# Patient Record
Sex: Male | Born: 1975 | Race: Black or African American | Hispanic: No | Marital: Married | Smoking: Never smoker
Health system: Southern US, Community
[De-identification: ages and names within clinical notes are randomized; demographics above are authoritative.]

## PROBLEM LIST (undated history)

## (undated) HISTORY — PX: LEG SURGERY: SHX1003

---

## 2011-11-20 ENCOUNTER — Encounter: Payer: Self-pay | Admitting: Emergency Medicine

## 2011-11-20 ENCOUNTER — Emergency Department (HOSPITAL_COMMUNITY)
Admission: EM | Admit: 2011-11-20 | Discharge: 2011-11-21 | Disposition: A | Discharge status: Home / Self Care | Payer: BC Managed Care – PPO | Attending: Emergency Medicine | Admitting: Emergency Medicine

## 2011-11-20 ENCOUNTER — Emergency Department (HOSPITAL_COMMUNITY): Payer: BC Managed Care – PPO

## 2011-11-20 DIAGNOSIS — R209 Unspecified disturbances of skin sensation: Secondary | ICD-10-CM | POA: Insufficient documentation

## 2011-11-20 DIAGNOSIS — S8990XA Unspecified injury of unspecified lower leg, initial encounter: Secondary | ICD-10-CM

## 2011-11-20 DIAGNOSIS — W19XXXA Unspecified fall, initial encounter: Secondary | ICD-10-CM

## 2011-11-20 DIAGNOSIS — IMO0002 Reserved for concepts with insufficient information to code with codable children: Secondary | ICD-10-CM | POA: Insufficient documentation

## 2011-11-20 DIAGNOSIS — M7989 Other specified soft tissue disorders: Secondary | ICD-10-CM | POA: Insufficient documentation

## 2011-11-20 DIAGNOSIS — W108XXA Fall (on) (from) other stairs and steps, initial encounter: Secondary | ICD-10-CM | POA: Insufficient documentation

## 2011-11-20 DIAGNOSIS — S8390XA Sprain of unspecified site of unspecified knee, initial encounter: Secondary | ICD-10-CM

## 2011-11-20 DIAGNOSIS — M25469 Effusion, unspecified knee: Secondary | ICD-10-CM | POA: Insufficient documentation

## 2011-11-20 DIAGNOSIS — M25569 Pain in unspecified knee: Secondary | ICD-10-CM | POA: Insufficient documentation

## 2011-11-20 DIAGNOSIS — M25461 Effusion, right knee: Secondary | ICD-10-CM

## 2011-11-20 MED ORDER — IBUPROFEN 800 MG PO TABS
800.0000 mg | ORAL_TABLET | Freq: Once | ORAL | Status: AC
  Administered 2011-11-21: 800 mg via ORAL
  Filled 2011-11-20: qty 1

## 2011-11-20 MED ORDER — HYDROCODONE-ACETAMINOPHEN 5-325 MG PO TABS
1.0000 | ORAL_TABLET | Freq: Once | ORAL | Status: AC
  Administered 2011-11-21: 1 via ORAL
  Filled 2011-11-20: qty 1

## 2011-11-20 NOTE — ED Notes (Signed)
Pt st's he was going down steps and missed a step st's right knee twisted and buckled under him.

## 2011-11-21 MED ORDER — IBUPROFEN 400 MG PO TABS: 400.0000 mg | ORAL_TABLET | Freq: Three times a day (TID) | ORAL | Status: AC | PRN

## 2011-11-21 MED ORDER — HYDROCODONE-ACETAMINOPHEN 5-500 MG PO TABS: 1.0000 | ORAL_TABLET | ORAL | Status: AC | PRN

## 2011-11-21 NOTE — ED Provider Notes (Signed)
History     CSN: [id number] Arrival date & time: 11/20/2011 10:05 PM   First MD Initiated Contact with Patient 11/20/11 2321      Chief Complaint  Patient presents with  . Knee Injury   HPI: Patient is a 35 y.o. male presenting with knee pain.  Knee Pain This is a new problem. The current episode started today. The problem occurs constantly. Associated symptoms include joint swelling and numbness. Pertinent negatives include no weakness. The symptoms are aggravated by walking. He has tried ice for the symptoms. The treatment provided mild relief.   reports he fell tonight going down stairs approximately 8:30 PM. The mechanism of injury is described as a twisting type injury during the fall and blunt trauma. States was unable to bear weight due to pain after the fall. Since all has had increasing swelling and pain to the right knee.  No past medical history on file.  No past surgical history on file.  No family history on file.  History  Substance Use Topics  . Smoking status: Not on file  . Smokeless tobacco: Not on file  . Alcohol Use: Not on file      Review of Systems  Constitutional: Negative.   HENT: Negative.   Eyes: Negative.   Respiratory: Negative.   Cardiovascular: Negative.   Gastrointestinal: Negative.   Genitourinary: Negative.   Musculoskeletal: Positive for joint swelling.  Skin: Negative.   Neurological: Positive for numbness. Negative for weakness.  Hematological: Negative.   Psychiatric/Behavioral: Negative.     Allergies  Review of patient's allergies indicates no known allergies.  Home Medications  No current outpatient prescriptions on file.  BP 128/87  Pulse 77  Temp(Src) 97.6 F (36.4 C) (Oral)  Resp 18  SpO2 98%  Physical Exam  Constitutional: He appears well-developed and well-nourished.  HENT:  Head: Normocephalic and atraumatic.  Eyes: Conjunctivae are normal.  Cardiovascular: Normal rate.   Pulmonary/Chest: Effort normal.    Musculoskeletal: Normal range of motion.       Legs:      Mild swelling and TTP to (R) knee. No obvious deformity or open wound.  Neurological: He is alert.  Skin: Skin is warm and dry.  Psychiatric: He has a normal mood and affect.    ED Course  Procedures findings and impression discussed with patient. Will place(R)  knee immobilizer, fit patient for crutches and provide short course of medication for pain. Patient is to arrange followup with his orthopedist. Patient is agreeable with pain  Labs Reviewed - No data to display Dg Knee Complete 4 Views Right  11/20/2011  *RADIOLOGY REPORT*  Clinical Data: Right knee pain status post fall.  RIGHT KNEE - COMPLETE 4+ VIEW  Comparison: None.  Findings: No displaced acute fracture or dislocation identified. No aggressive appearing osseous lesion.  Tiny joint effusion.  IMPRESSION: No acute osseous abnormality.  Tiny joint effusion.If clinical concern for a fracture persists, recommend a repeat radiograph in 5- 10 days to evaluate for interval change or callus formation.  Original Report Authenticated By: Waneta Martins, M.D.     No diagnosis found.    MDM  (R) knee effusion s/p fall. Small fx can not be excluded. Ortho f/u recommended.        Leanne Chang, NP 11/21/11 [phone]

## 2011-11-21 NOTE — ED Notes (Signed)
Pt reports knee feels better after immobilization.

## 2011-11-21 NOTE — ED Notes (Signed)
Pt ambulated with a steady gait; VSS; A&Ox3; no signs of distress; no questions at this time; respirations even and unlabored; skin warm and dry.  

## 2011-11-21 NOTE — ED Provider Notes (Signed)
Medical screening examination/treatment/procedure(s) were performed by non-physician practitioner and as supervising physician I was immediately available for consultation/collaboration.    Zakariya Knickerbocker R Jinna Weinman, MD 11/21/11 0728 

## 2011-11-21 NOTE — ED Notes (Signed)
Ortho tech paged  

## 2011-11-25 ENCOUNTER — Other Ambulatory Visit: Payer: Self-pay | Admitting: Orthopedic Surgery

## 2011-11-28 ENCOUNTER — Other Ambulatory Visit: Payer: Self-pay | Admitting: Family Medicine

## 2011-11-28 DIAGNOSIS — M25561 Pain in right knee: Secondary | ICD-10-CM

## 2011-11-28 MED ORDER — CEFAZOLIN SODIUM-DEXTROSE 2-3 GM-% IV SOLR: 2.0000 g | INTRAVENOUS | Status: DC

## 2011-11-29 ENCOUNTER — Encounter (HOSPITAL_COMMUNITY)
Admission: RE | Disposition: A | Discharge status: Home / Self Care | Payer: Self-pay | Source: Ambulatory Visit | Attending: Orthopedic Surgery

## 2011-11-29 ENCOUNTER — Ambulatory Visit (HOSPITAL_COMMUNITY)
Admission: RE | Admit: 2011-11-29 | Discharge: 2011-11-29 | Disposition: A | Discharge status: Home / Self Care | Payer: BC Managed Care – PPO | Source: Ambulatory Visit | Attending: Orthopedic Surgery | Admitting: Orthopedic Surgery

## 2011-11-29 SURGERY — REPAIR, TENDON, PATELLAR
Anesthesia: Choice | Laterality: Right

## 2011-12-04 ENCOUNTER — Ambulatory Visit
Admission: RE | Admit: 2011-12-04 | Discharge: 2011-12-04 | Disposition: A | Discharge status: Home / Self Care | Payer: BC Managed Care – PPO | Source: Ambulatory Visit | Attending: Family Medicine | Admitting: Family Medicine

## 2011-12-04 DIAGNOSIS — M25561 Pain in right knee: Secondary | ICD-10-CM

## 2011-12-05 ENCOUNTER — Other Ambulatory Visit: Payer: BC Managed Care – PPO

## 2012-01-15 ENCOUNTER — Emergency Department (HOSPITAL_BASED_OUTPATIENT_CLINIC_OR_DEPARTMENT_OTHER)
Admission: EM | Admit: 2012-01-15 | Discharge: 2012-01-15 | Disposition: A | Discharge status: Home / Self Care | Payer: No Typology Code available for payment source | Attending: Emergency Medicine | Admitting: Emergency Medicine

## 2012-01-15 ENCOUNTER — Emergency Department (INDEPENDENT_AMBULATORY_CARE_PROVIDER_SITE_OTHER): Payer: No Typology Code available for payment source

## 2012-01-15 ENCOUNTER — Encounter (HOSPITAL_BASED_OUTPATIENT_CLINIC_OR_DEPARTMENT_OTHER): Payer: Self-pay | Admitting: Family Medicine

## 2012-01-15 DIAGNOSIS — M25569 Pain in unspecified knee: Secondary | ICD-10-CM

## 2012-01-15 DIAGNOSIS — IMO0002 Reserved for concepts with insufficient information to code with codable children: Secondary | ICD-10-CM | POA: Insufficient documentation

## 2012-01-15 DIAGNOSIS — Y92410 Unspecified street and highway as the place of occurrence of the external cause: Secondary | ICD-10-CM | POA: Insufficient documentation

## 2012-01-15 DIAGNOSIS — S8390XA Sprain of unspecified site of unspecified knee, initial encounter: Secondary | ICD-10-CM

## 2012-01-15 MED ORDER — IBUPROFEN 800 MG PO TABS
800.0000 mg | ORAL_TABLET | Freq: Once | ORAL | Status: AC
  Administered 2012-01-15: 800 mg via ORAL
  Filled 2012-01-15: qty 1

## 2012-01-15 NOTE — ED Notes (Signed)
Assisted patient with ambulation and provided urine bottle
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
## 2012-01-15 NOTE — ED Notes (Signed)
MD at bedside. 

## 2013-04-04 IMAGING — CR DG KNEE COMPLETE 4+V*R*
4 series · 4 of 4 positions shown · non-contrast
Comparison: None.

CLINICAL DATA: Right knee pain status post fall.

RIGHT KNEE - COMPLETE 4+ VIEW

[t knee obl right (1 of 2)]
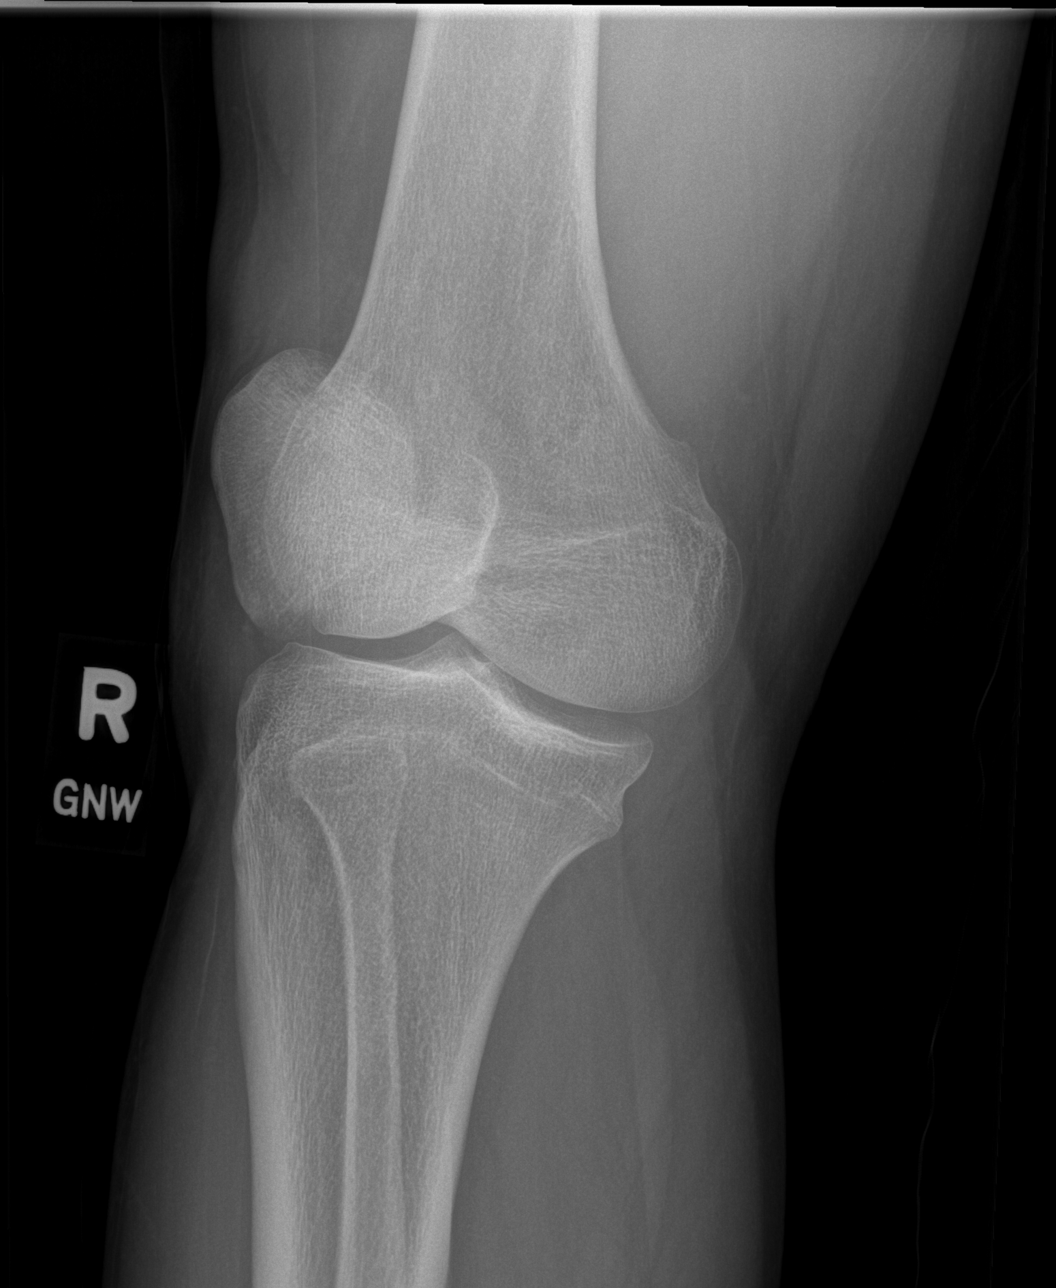

[t knee obl right (2 of 2)]
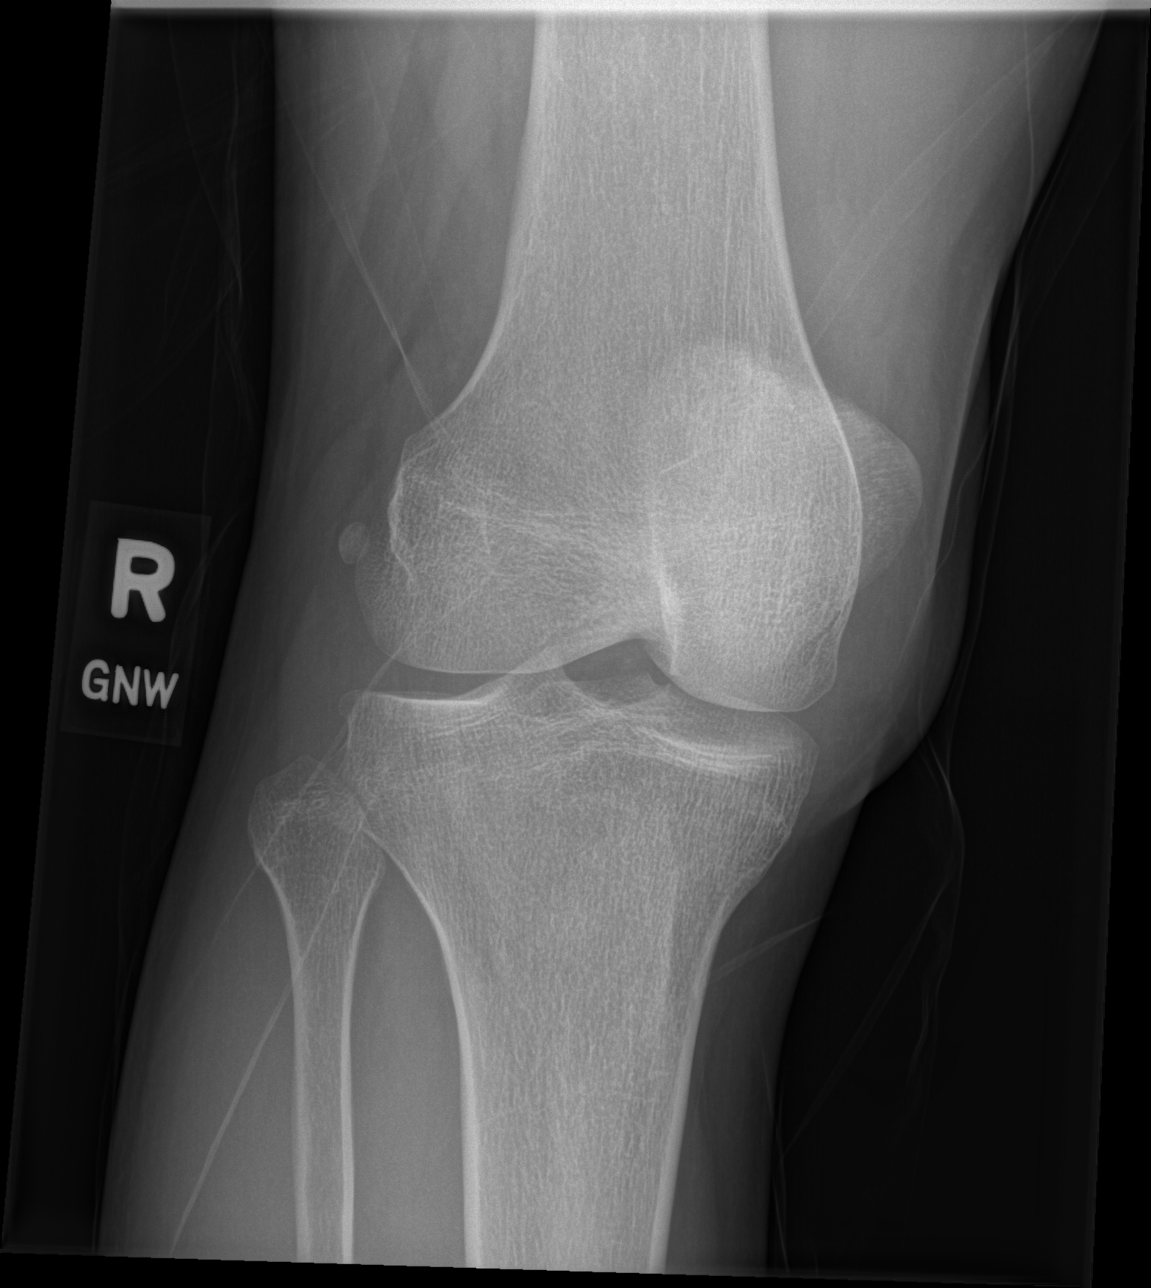

[t knee lat right]
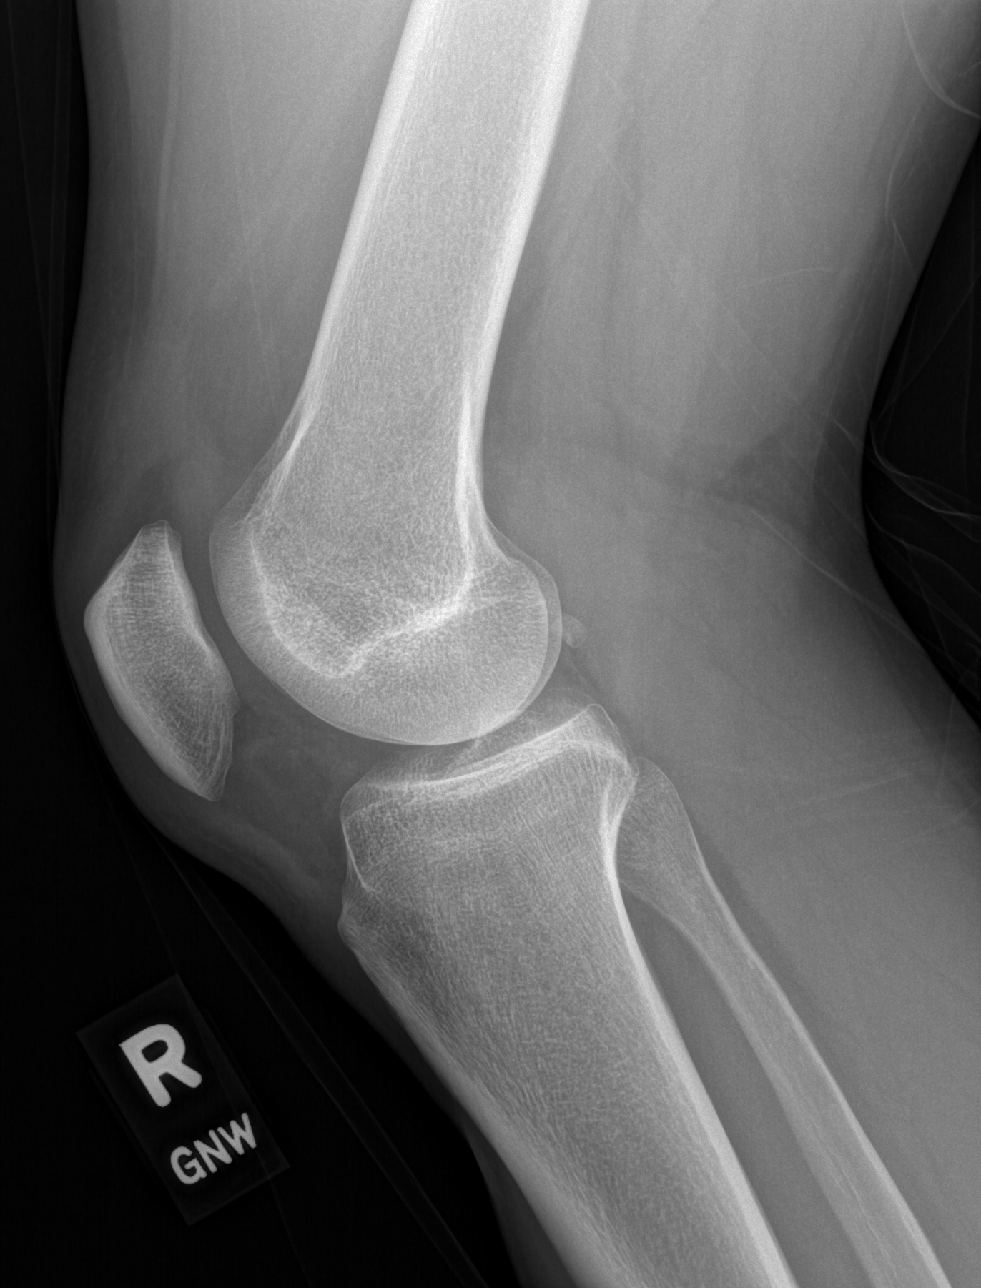

[t knee ap right]
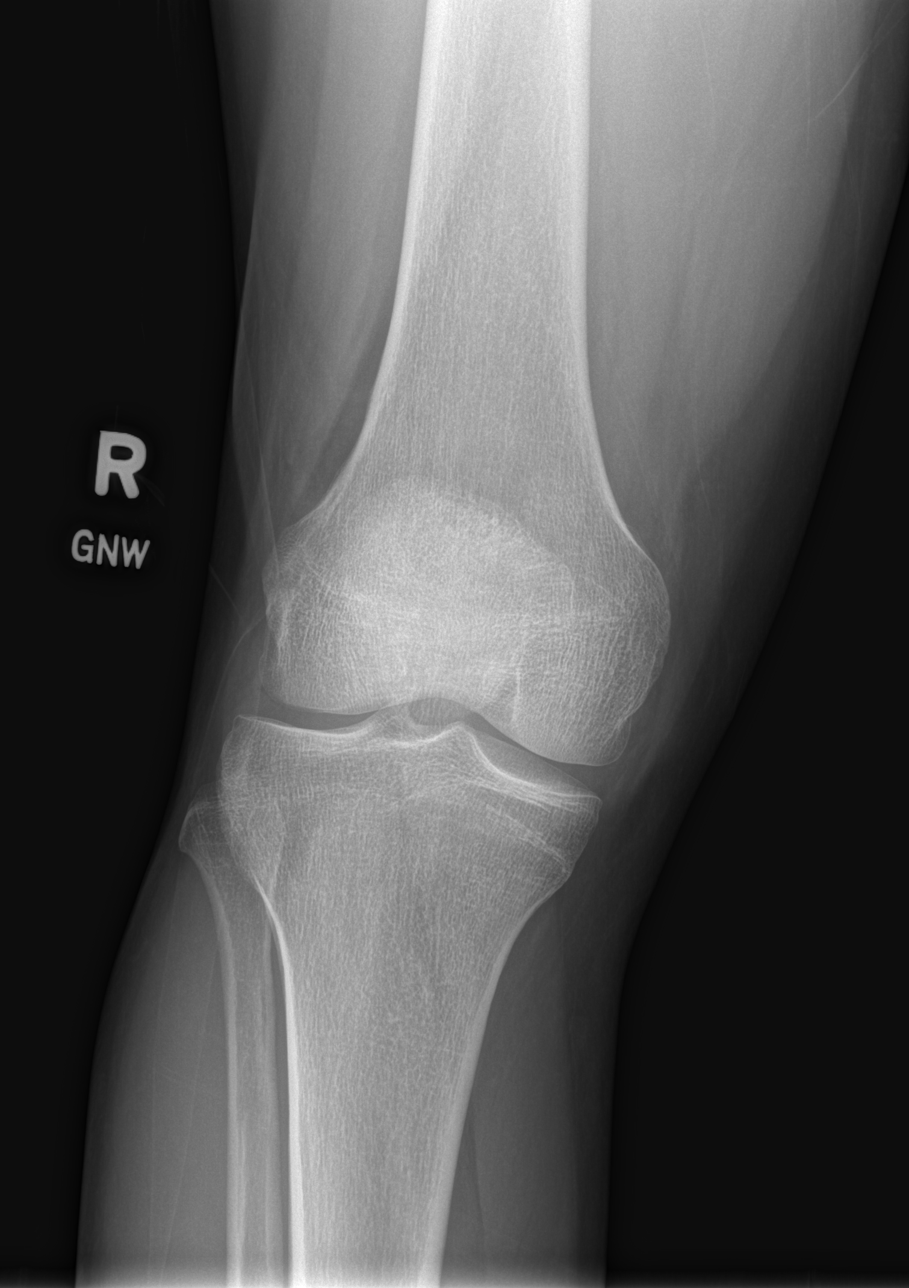

[4 of 4 positions shown; findings below may reference images not displayed]

FINDINGS: No displaced acute fracture or dislocation identified. No
aggressive appearing osseous lesion.  Tiny joint effusion.
IMPRESSION: No acute osseous abnormality.  Tiny joint effusion.If clinical
concern for a fracture persists, recommend a repeat radiograph in 5-
10 days to evaluate for interval change or callus formation.

## 2013-11-12 ENCOUNTER — Other Ambulatory Visit: Payer: Self-pay | Admitting: Family Medicine

## 2013-11-12 DIAGNOSIS — M7989 Other specified soft tissue disorders: Secondary | ICD-10-CM

## 2013-11-17 ENCOUNTER — Ambulatory Visit
Admission: RE | Admit: 2013-11-17 | Discharge: 2013-11-17 | Disposition: A | Discharge status: Home / Self Care | Payer: BC Managed Care – PPO | Source: Ambulatory Visit | Attending: Family Medicine | Admitting: Family Medicine

## 2013-11-17 ENCOUNTER — Other Ambulatory Visit: Payer: BC Managed Care – PPO

## 2013-11-17 DIAGNOSIS — M7989 Other specified soft tissue disorders: Secondary | ICD-10-CM

## 2013-11-17 MED ORDER — GADOBENATE DIMEGLUMINE 529 MG/ML IV SOLN
10.0000 mL | Freq: Once | INTRAVENOUS | Status: AC | PRN
  Administered 2013-11-17: 10 mL via INTRAVENOUS

## 2013-11-19 ENCOUNTER — Other Ambulatory Visit: Payer: BC Managed Care – PPO

## 2015-04-05 ENCOUNTER — Emergency Department (HOSPITAL_BASED_OUTPATIENT_CLINIC_OR_DEPARTMENT_OTHER)
Admission: EM | Admit: 2015-04-05 | Discharge: 2015-04-05 | Disposition: A | Discharge status: Home / Self Care | Payer: BLUE CROSS/BLUE SHIELD | Attending: Emergency Medicine | Admitting: Emergency Medicine

## 2015-04-05 ENCOUNTER — Encounter (HOSPITAL_BASED_OUTPATIENT_CLINIC_OR_DEPARTMENT_OTHER): Payer: Self-pay | Admitting: Emergency Medicine

## 2015-04-05 DIAGNOSIS — M545 Low back pain, unspecified: Secondary | ICD-10-CM

## 2015-04-05 MED ORDER — CYCLOBENZAPRINE HCL 10 MG PO TABS: 10.0000 mg | ORAL_TABLET | Freq: Three times a day (TID) | ORAL | Status: AC | PRN

## 2015-04-05 MED ORDER — DIAZEPAM 5 MG PO TABS
5.0000 mg | ORAL_TABLET | Freq: Once | ORAL | Status: AC
  Administered 2015-04-05: 5 mg via ORAL
  Filled 2015-04-05: qty 1

## 2015-04-05 MED ORDER — TRAMADOL HCL 50 MG PO TABS: 50.0000 mg | ORAL_TABLET | Freq: Four times a day (QID) | ORAL | Status: AC | PRN

## 2015-04-05 MED ORDER — KETOROLAC TROMETHAMINE 60 MG/2ML IM SOLN
60.0000 mg | Freq: Once | INTRAMUSCULAR | Status: AC
  Administered 2015-04-05: 60 mg via INTRAMUSCULAR
  Filled 2015-04-05: qty 2

## 2015-04-05 NOTE — Discharge Instructions (Signed)
Back Pain, Adult Low back pain is very common. About 1 in 5 people have back pain.The cause of low back pain is rarely dangerous. The pain often gets better over time.About half of people with a sudden onset of back pain feel better in just 2 weeks. About 8 in 10 people feel better by 6 weeks.  CAUSES Some common causes of back pain include:  Strain of the muscles or ligaments supporting the spine.  Wear and tear (degeneration) of the spinal discs.  Arthritis.  Direct injury to the back. DIAGNOSIS Most of the time, the direct cause of low back pain is not known.However, back pain can be treated effectively even when the exact cause of the pain is unknown.Answering your caregiver's questions about your overall health and symptoms is one of the most accurate ways to make sure the cause of your pain is not dangerous. If your caregiver needs more information, he or she may order lab work or imaging tests (X-rays or MRIs).However, even if imaging tests show changes in your back, this usually does not require surgery. HOME CARE INSTRUCTIONS For many people, back pain returns.Since low back pain is rarely dangerous, it is often a condition that people can learn to manageon their own.   Remain active. It is stressful on the back to sit or stand in one place. Do not sit, drive, or stand in one place for more than 30 minutes at a time. Take short walks on level surfaces as soon as pain allows.Try to increase the length of time you walk each day.  Do not stay in bed.Resting more than 1 or 2 days can delay your recovery.  Do not avoid exercise or work.Your body is made to move.It is not dangerous to be active, even though your back may hurt.Your back will likely heal faster if you return to being active before your pain is gone.  Pay attention to your body when you bend and lift. Many people have less discomfortwhen lifting if they bend their knees, keep the load close to their bodies,and  avoid twisting. Often, the most comfortable positions are those that put less stress on your recovering back.  Find a comfortable position to sleep. Use a firm mattress and lie on your side with your knees slightly bent. If you lie on your back, put a pillow under your knees.  Only take over-the-counter or prescription medicines as directed by your caregiver. Over-the-counter medicines to reduce pain and inflammation are often the most helpful.Your caregiver may prescribe muscle relaxant drugs.These medicines help dull your pain so you can more quickly return to your normal activities and healthy exercise.  Put ice on the injured area.  Put ice in a plastic bag.  Place a towel between your skin and the bag.  Leave the ice on for 15-20 minutes, 03-04 times a day for the first 2 to 3 days. After that, ice and heat may be alternated to reduce pain and spasms.  Ask your caregiver about trying back exercises and gentle massage. This may be of some benefit.  Avoid feeling anxious or stressed.Stress increases muscle tension and can worsen back pain.It is important to recognize when you are anxious or stressed and learn ways to manage it.Exercise is a great option. SEEK MEDICAL CARE IF:  You have pain that is not relieved with rest or medicine.  You have pain that does not improve in 1 week.  You have new symptoms.  You are generally not feeling well. SEEK   IMMEDIATE MEDICAL CARE IF:   You have pain that radiates from your back into your legs.  You develop new bowel or bladder control problems.  You have unusual weakness or numbness in your arms or legs.  You develop nausea or vomiting.  You develop abdominal pain.  You feel faint. Document Released: 11/18/2005 Document Revised: 05/19/2012 Document Reviewed: 03/22/2014 [employer] Patient Information 2015 [employer]. This information is not intended to replace advice given to you by your health care provider. Make sure you  discuss any questions you have with your health care provider.  

## 2015-04-05 NOTE — ED Notes (Signed)
Lower back pain for several days.  Worsening over time.  No elimination problems.  No numbness or tingling in feet.

## 2015-04-05 NOTE — ED Provider Notes (Signed)
CSN: [id number]     Arrival date & time 04/05/15  0815 History   First MD Initiated Contact with Patient 04/05/15 [id number]     Chief Complaint  Patient presents with  . Back Pain     (Consider location/radiation/quality/duration/timing/severity/associated sxs/prior Treatment) HPI Comments: Patient presents to the emergency department for back pain. Patient reports that he started to feel some pain in his right lower back 2 days ago while mowing the lawn. He was taking ibuprofen with some improvement initially, but the pain did slowly worsened yesterday. Upon awakening today pain is severe. He had difficulty getting out of bed. Patient reports severe pain trying to bend over or twist. He does not have any radiation of pain to the lower extremities. No numbness, tickling or weakness of lower extremities. Patient has not had any change in bowel or bladder function. He denies direct trauma.  Patient is a 39 y.o. male presenting with back pain.  Back Pain   No past medical history on file. Past Surgical History  Procedure Laterality Date  . Leg surgery     No family history on file. History  Substance Use Topics  . Smoking status: Never Smoker   . Smokeless tobacco: Not on file  . Alcohol Use: No    Review of Systems  Musculoskeletal: Positive for back pain.  All other systems reviewed and are negative.     Allergies  Review of patient's allergies indicates no known allergies.  Home Medications   Prior to Admission medications   Medication Sig Start Date End Date Taking? Authorizing Provider  ibuprofen (ADVIL,MOTRIN) 800 MG tablet Take 800 mg by mouth every 8 (eight) hours as needed.    Historical Provider, MD   BP 155/85 mmHg  Pulse 72  Temp(Src) 98.5 F (36.9 C) (Oral)  Resp 18  Ht 5\' 11"  (1.803 m)  Wt 230 lb (104.327 kg)  BMI 32.09 kg/m2  SpO2 97% Physical Exam  Constitutional: He is oriented to person, place, and time. He appears well-developed and well-nourished. No  distress.  HENT:  Head: Normocephalic and atraumatic.  Right Ear: Hearing normal.  Left Ear: Hearing normal.  Nose: Nose normal.  Mouth/Throat: Oropharynx is clear and moist and mucous membranes are normal.  Eyes: Conjunctivae and EOM are normal. Pupils are equal, round, and reactive to light.  Neck: Normal range of motion. Neck supple.  Cardiovascular: Regular rhythm, S1 normal and S2 normal.  Exam reveals no gallop and no friction rub.   No murmur heard. Pulmonary/Chest: Effort normal and breath sounds normal. No respiratory distress. He exhibits no tenderness.  Abdominal: Soft. Normal appearance and bowel sounds are normal. There is no hepatosplenomegaly. There is no tenderness. There is no rebound, no guarding, no tenderness at McBurney's point and negative Murphy's sign. No hernia.  Musculoskeletal: Normal range of motion.       Lumbar back: He exhibits tenderness. He exhibits no bony tenderness.       Back:  Neurological: He is alert and oriented to person, place, and time. He has normal strength. No cranial nerve deficit or sensory deficit. Coordination normal. GCS eye subscore is 4. GCS verbal subscore is 5. GCS motor subscore is 6.  Reflex Scores:      Patellar reflexes are 2+ on the right side. Skin: Skin is warm, dry and intact. No rash noted. No cyanosis.  Psychiatric: He has a normal mood and affect. His speech is normal and behavior is normal. Thought content normal.  Nursing note and  vitals reviewed.   ED Course  Procedures (including critical care time) Labs Review Labs Reviewed - No data to display  Imaging Review No results found.   EKG Interpretation None      MDM   Final diagnoses:  None   back pain  Patient presents to the ER with musculoskeletal back pain. Examination reveals back tenderness without any associated neurologic findings. Patient's strength, sensation and reflexes were normal. As such, patient did not require any imaging or further  studies. Patient was treated with analgesia.    Gilda Creasehristopher J Dyann Goodspeed, MD 04/05/15 [phone]

## 2019-01-22 DIAGNOSIS — M25562 Pain in left knee: Secondary | ICD-10-CM | POA: Diagnosis not present

## 2019-01-25 DIAGNOSIS — M25562 Pain in left knee: Secondary | ICD-10-CM | POA: Diagnosis not present

## 2019-02-05 DIAGNOSIS — M66252 Spontaneous rupture of extensor tendons, left thigh: Secondary | ICD-10-CM | POA: Diagnosis not present

## 2019-02-05 DIAGNOSIS — G8918 Other acute postprocedural pain: Secondary | ICD-10-CM | POA: Diagnosis not present

## 2019-02-24 DIAGNOSIS — M66252 Spontaneous rupture of extensor tendons, left thigh: Secondary | ICD-10-CM | POA: Diagnosis not present

## 2019-03-01 DIAGNOSIS — S83512D Sprain of anterior cruciate ligament of left knee, subsequent encounter: Secondary | ICD-10-CM | POA: Diagnosis not present

## 2019-03-11 DIAGNOSIS — M25562 Pain in left knee: Secondary | ICD-10-CM | POA: Diagnosis not present

## 2019-03-18 DIAGNOSIS — M25562 Pain in left knee: Secondary | ICD-10-CM | POA: Diagnosis not present

## 2019-03-24 DIAGNOSIS — M25562 Pain in left knee: Secondary | ICD-10-CM | POA: Diagnosis not present

## 2019-03-31 DIAGNOSIS — M25562 Pain in left knee: Secondary | ICD-10-CM | POA: Diagnosis not present

## 2019-04-09 DIAGNOSIS — M25562 Pain in left knee: Secondary | ICD-10-CM | POA: Diagnosis not present

## 2019-04-14 DIAGNOSIS — M25562 Pain in left knee: Secondary | ICD-10-CM | POA: Diagnosis not present

## 2019-04-19 DIAGNOSIS — M25562 Pain in left knee: Secondary | ICD-10-CM | POA: Diagnosis not present

## 2019-04-28 DIAGNOSIS — M25562 Pain in left knee: Secondary | ICD-10-CM | POA: Diagnosis not present

## 2019-05-04 DIAGNOSIS — Z131 Encounter for screening for diabetes mellitus: Secondary | ICD-10-CM | POA: Diagnosis not present

## 2019-05-04 DIAGNOSIS — Z13220 Encounter for screening for lipoid disorders: Secondary | ICD-10-CM | POA: Diagnosis not present

## 2019-05-04 DIAGNOSIS — Z0000 Encounter for general adult medical examination without abnormal findings: Secondary | ICD-10-CM | POA: Diagnosis not present

## 2019-05-04 DIAGNOSIS — Z125 Encounter for screening for malignant neoplasm of prostate: Secondary | ICD-10-CM | POA: Diagnosis not present

## 2019-05-05 DIAGNOSIS — M25562 Pain in left knee: Secondary | ICD-10-CM | POA: Diagnosis not present

## 2019-05-12 DIAGNOSIS — M25562 Pain in left knee: Secondary | ICD-10-CM | POA: Diagnosis not present

## 2019-05-19 DIAGNOSIS — M25562 Pain in left knee: Secondary | ICD-10-CM | POA: Diagnosis not present

## 2019-10-26 DIAGNOSIS — Z20828 Contact with and (suspected) exposure to other viral communicable diseases: Secondary | ICD-10-CM | POA: Diagnosis not present

## 2019-11-16 DIAGNOSIS — Z800 Family history of malignant neoplasm of digestive organs: Secondary | ICD-10-CM | POA: Diagnosis not present

## 2019-11-16 DIAGNOSIS — Z8371 Family history of colonic polyps: Secondary | ICD-10-CM | POA: Diagnosis not present

## 2019-11-16 DIAGNOSIS — Z01818 Encounter for other preprocedural examination: Secondary | ICD-10-CM | POA: Diagnosis not present

## 2019-12-13 DIAGNOSIS — Z1159 Encounter for screening for other viral diseases: Secondary | ICD-10-CM | POA: Diagnosis not present

## 2019-12-16 DIAGNOSIS — Z8371 Family history of colonic polyps: Secondary | ICD-10-CM | POA: Diagnosis not present

## 2019-12-16 DIAGNOSIS — Z800 Family history of malignant neoplasm of digestive organs: Secondary | ICD-10-CM | POA: Diagnosis not present

## 2019-12-16 DIAGNOSIS — Z1211 Encounter for screening for malignant neoplasm of colon: Secondary | ICD-10-CM | POA: Diagnosis not present

## 2020-01-03 ENCOUNTER — Ambulatory Visit: Payer: BC Managed Care – PPO | Attending: Internal Medicine

## 2020-01-03 DIAGNOSIS — Z20822 Contact with and (suspected) exposure to covid-19: Secondary | ICD-10-CM

## 2020-01-04 LAB — NOVEL CORONAVIRUS, NAA: SARS-CoV-2, NAA: NOT DETECTED

## 2020-02-11 ENCOUNTER — Ambulatory Visit: Payer: BC Managed Care – PPO | Attending: Internal Medicine

## 2020-02-11 DIAGNOSIS — Z23 Encounter for immunization: Secondary | ICD-10-CM

## 2020-02-11 NOTE — Progress Notes (Signed)
   Covid-19 Vaccination Clinic  Name:  Bradley Schmitt    MRN: [id number] DOB: Feb 20, 1976  02/11/2020  [name] was observed post Covid-19 immunization for 15 minutes without incident. He was provided with Vaccine Information Sheet and instruction to access the V-Safe system.   [name] was instructed to call 911 with any severe reactions post vaccine: Marland Kitchen Difficulty breathing  . Swelling of face and throat  . A fast heartbeat  . A bad rash all over body  . Dizziness and weakness   Immunizations Administered    Name Date Dose VIS Date Route   Pfizer COVID-19 Vaccine 02/11/2020 10:11 AM 0.3 mL 11/12/2019 Intramuscular   Manufacturer: [employer], [employer]   Lot: [id number]   NDC: [address]-3

## 2020-03-06 ENCOUNTER — Ambulatory Visit: Payer: BC Managed Care – PPO | Attending: Internal Medicine

## 2020-03-06 DIAGNOSIS — Z23 Encounter for immunization: Secondary | ICD-10-CM

## 2020-03-06 NOTE — Progress Notes (Signed)
   Covid-19 Vaccination Clinic  Name:  Bradley Schmitt    MRN: [id number] DOB: [birth year]  03/06/2020  [name] was observed post Covid-19 immunization for 15 minutes without incident. He was provided with Vaccine Information Sheet and instruction to access the V-Safe system.   [name] was instructed to call 911 with any severe reactions post vaccine: Marland Kitchen Difficulty breathing  . Swelling of face and throat  . A fast heartbeat  . A bad rash all over body  . Dizziness and weakness   Immunizations Administered    Name Date Dose VIS Date Route   Pfizer COVID-19 Vaccine 03/06/2020  2:12 PM 0.3 mL 11/12/2019 Intramuscular   Manufacturer: [employer], [employer]   Lot: [id number]   NDC: [address]-8

## 2021-01-11 DIAGNOSIS — I10 Essential (primary) hypertension: Secondary | ICD-10-CM | POA: Diagnosis not present

## 2021-01-11 DIAGNOSIS — Z23 Encounter for immunization: Secondary | ICD-10-CM | POA: Diagnosis not present

## 2021-01-11 DIAGNOSIS — Z0000 Encounter for general adult medical examination without abnormal findings: Secondary | ICD-10-CM | POA: Diagnosis not present

## 2021-01-11 DIAGNOSIS — Z0001 Encounter for general adult medical examination with abnormal findings: Secondary | ICD-10-CM | POA: Diagnosis not present

## 2021-01-16 DIAGNOSIS — I10 Essential (primary) hypertension: Secondary | ICD-10-CM | POA: Diagnosis not present

## 2021-01-16 DIAGNOSIS — Z0000 Encounter for general adult medical examination without abnormal findings: Secondary | ICD-10-CM | POA: Diagnosis not present

## 2021-01-26 DIAGNOSIS — C9110 Chronic lymphocytic leukemia of B-cell type not having achieved remission: Secondary | ICD-10-CM | POA: Diagnosis not present

## 2021-01-26 DIAGNOSIS — D72820 Lymphocytosis (symptomatic): Secondary | ICD-10-CM | POA: Diagnosis not present

## 2021-02-02 DIAGNOSIS — C9110 Chronic lymphocytic leukemia of B-cell type not having achieved remission: Secondary | ICD-10-CM | POA: Diagnosis not present

## 2021-02-02 DIAGNOSIS — E669 Obesity, unspecified: Secondary | ICD-10-CM | POA: Diagnosis not present

## 2021-02-02 DIAGNOSIS — I10 Essential (primary) hypertension: Secondary | ICD-10-CM | POA: Diagnosis not present

## 2021-02-02 DIAGNOSIS — Z6834 Body mass index (BMI) 34.0-34.9, adult: Secondary | ICD-10-CM | POA: Diagnosis not present

## 2021-02-08 DIAGNOSIS — I10 Essential (primary) hypertension: Secondary | ICD-10-CM | POA: Diagnosis not present

## 2021-02-08 DIAGNOSIS — Z125 Encounter for screening for malignant neoplasm of prostate: Secondary | ICD-10-CM | POA: Diagnosis not present

## 2021-02-08 DIAGNOSIS — Z0000 Encounter for general adult medical examination without abnormal findings: Secondary | ICD-10-CM | POA: Diagnosis not present

## 2021-02-08 DIAGNOSIS — E7800 Pure hypercholesterolemia, unspecified: Secondary | ICD-10-CM | POA: Diagnosis not present

## 2021-02-08 DIAGNOSIS — G4730 Sleep apnea, unspecified: Secondary | ICD-10-CM | POA: Diagnosis not present

## 2021-02-12 DIAGNOSIS — D479 Neoplasm of uncertain behavior of lymphoid, hematopoietic and related tissue, unspecified: Secondary | ICD-10-CM | POA: Diagnosis not present

## 2021-02-12 DIAGNOSIS — E785 Hyperlipidemia, unspecified: Secondary | ICD-10-CM | POA: Diagnosis not present

## 2021-02-12 DIAGNOSIS — C9110 Chronic lymphocytic leukemia of B-cell type not having achieved remission: Secondary | ICD-10-CM | POA: Diagnosis not present

## 2021-02-12 DIAGNOSIS — Z79899 Other long term (current) drug therapy: Secondary | ICD-10-CM | POA: Diagnosis not present

## 2021-05-07 DIAGNOSIS — E6609 Other obesity due to excess calories: Secondary | ICD-10-CM | POA: Diagnosis not present

## 2021-05-07 DIAGNOSIS — E782 Mixed hyperlipidemia: Secondary | ICD-10-CM | POA: Diagnosis not present

## 2021-05-07 DIAGNOSIS — C9110 Chronic lymphocytic leukemia of B-cell type not having achieved remission: Secondary | ICD-10-CM | POA: Diagnosis not present

## 2021-05-07 DIAGNOSIS — I10 Essential (primary) hypertension: Secondary | ICD-10-CM | POA: Diagnosis not present

## 2021-05-08 DIAGNOSIS — C9110 Chronic lymphocytic leukemia of B-cell type not having achieved remission: Secondary | ICD-10-CM | POA: Diagnosis not present

## 2021-05-14 DIAGNOSIS — C9110 Chronic lymphocytic leukemia of B-cell type not having achieved remission: Secondary | ICD-10-CM | POA: Diagnosis not present

## 2021-08-14 DIAGNOSIS — C9110 Chronic lymphocytic leukemia of B-cell type not having achieved remission: Secondary | ICD-10-CM | POA: Diagnosis not present

## 2021-10-31 DIAGNOSIS — Z20822 Contact with and (suspected) exposure to covid-19: Secondary | ICD-10-CM | POA: Diagnosis not present
